# Patient Record
Sex: Female | Born: 1940
Health system: Southern US, Community
[De-identification: ages and names within clinical notes are randomized; demographics above are authoritative.]

---

## 1998-12-30 ENCOUNTER — Other Ambulatory Visit: Admission: RE | Admit: 1998-12-30 | Discharge: 1998-12-30 | Payer: Self-pay | Admitting: Internal Medicine

## 2001-06-11 ENCOUNTER — Other Ambulatory Visit: Admission: RE | Admit: 2001-06-11 | Discharge: 2001-06-11 | Payer: Self-pay | Admitting: Internal Medicine

## 2001-09-18 ENCOUNTER — Ambulatory Visit (HOSPITAL_COMMUNITY): Admission: RE | Admit: 2001-09-18 | Discharge: 2001-09-18 | Payer: Self-pay | Admitting: *Deleted

## 2003-04-22 ENCOUNTER — Ambulatory Visit (HOSPITAL_COMMUNITY): Admission: RE | Admit: 2003-04-22 | Discharge: 2003-04-22 | Payer: Self-pay | Admitting: *Deleted

## 2003-07-29 ENCOUNTER — Encounter: Admission: RE | Admit: 2003-07-29 | Discharge: 2003-07-29 | Payer: Self-pay | Admitting: Internal Medicine

## 2006-03-06 ENCOUNTER — Other Ambulatory Visit: Admission: RE | Admit: 2006-03-06 | Discharge: 2006-03-06 | Payer: Self-pay | Admitting: Internal Medicine

## 2006-08-27 ENCOUNTER — Inpatient Hospital Stay (HOSPITAL_COMMUNITY): Admission: AD | Admit: 2006-08-27 | Discharge: 2006-08-27 | Payer: Self-pay | Admitting: Obstetrics & Gynecology

## 2006-08-29 ENCOUNTER — Ambulatory Visit (HOSPITAL_COMMUNITY): Admission: RE | Admit: 2006-08-29 | Discharge: 2006-08-29 | Payer: Self-pay | Admitting: *Deleted

## 2011-09-07 DIAGNOSIS — H35349 Macular cyst, hole, or pseudohole, unspecified eye: Secondary | ICD-10-CM | POA: Diagnosis not present

## 2011-09-07 DIAGNOSIS — H43819 Vitreous degeneration, unspecified eye: Secondary | ICD-10-CM | POA: Diagnosis not present

## 2011-09-07 DIAGNOSIS — H35379 Puckering of macula, unspecified eye: Secondary | ICD-10-CM | POA: Diagnosis not present

## 2011-10-18 DIAGNOSIS — H2510 Age-related nuclear cataract, unspecified eye: Secondary | ICD-10-CM | POA: Diagnosis not present

## 2011-11-13 DIAGNOSIS — H43819 Vitreous degeneration, unspecified eye: Secondary | ICD-10-CM | POA: Diagnosis not present

## 2011-11-13 DIAGNOSIS — H35349 Macular cyst, hole, or pseudohole, unspecified eye: Secondary | ICD-10-CM | POA: Diagnosis not present

## 2012-05-03 DIAGNOSIS — H35349 Macular cyst, hole, or pseudohole, unspecified eye: Secondary | ICD-10-CM | POA: Diagnosis not present

## 2012-05-03 DIAGNOSIS — H2510 Age-related nuclear cataract, unspecified eye: Secondary | ICD-10-CM | POA: Diagnosis not present

## 2012-05-20 DIAGNOSIS — H43829 Vitreomacular adhesion, unspecified eye: Secondary | ICD-10-CM | POA: Diagnosis not present

## 2012-05-20 DIAGNOSIS — H35349 Macular cyst, hole, or pseudohole, unspecified eye: Secondary | ICD-10-CM | POA: Diagnosis not present

## 2012-10-17 DIAGNOSIS — L738 Other specified follicular disorders: Secondary | ICD-10-CM | POA: Diagnosis not present

## 2012-10-17 DIAGNOSIS — L259 Unspecified contact dermatitis, unspecified cause: Secondary | ICD-10-CM | POA: Diagnosis not present

## 2012-10-17 DIAGNOSIS — D235 Other benign neoplasm of skin of trunk: Secondary | ICD-10-CM | POA: Diagnosis not present

## 2012-10-17 DIAGNOSIS — L570 Actinic keratosis: Secondary | ICD-10-CM | POA: Diagnosis not present

## 2012-11-06 DIAGNOSIS — I10 Essential (primary) hypertension: Secondary | ICD-10-CM | POA: Diagnosis not present

## 2012-11-07 DIAGNOSIS — I10 Essential (primary) hypertension: Secondary | ICD-10-CM | POA: Diagnosis not present

## 2012-11-07 DIAGNOSIS — Z0000 Encounter for general adult medical examination without abnormal findings: Secondary | ICD-10-CM | POA: Diagnosis not present

## 2012-11-12 DIAGNOSIS — I10 Essential (primary) hypertension: Secondary | ICD-10-CM | POA: Diagnosis not present

## 2012-12-31 DIAGNOSIS — I10 Essential (primary) hypertension: Secondary | ICD-10-CM | POA: Diagnosis not present

## 2013-02-04 DIAGNOSIS — H35349 Macular cyst, hole, or pseudohole, unspecified eye: Secondary | ICD-10-CM | POA: Insufficient documentation

## 2013-02-04 DIAGNOSIS — H2510 Age-related nuclear cataract, unspecified eye: Secondary | ICD-10-CM | POA: Diagnosis not present

## 2013-02-04 DIAGNOSIS — H43829 Vitreomacular adhesion, unspecified eye: Secondary | ICD-10-CM | POA: Diagnosis not present

## 2013-03-03 DIAGNOSIS — Z0000 Encounter for general adult medical examination without abnormal findings: Secondary | ICD-10-CM | POA: Diagnosis not present

## 2013-03-03 DIAGNOSIS — I10 Essential (primary) hypertension: Secondary | ICD-10-CM | POA: Diagnosis not present

## 2013-03-13 DIAGNOSIS — Z0000 Encounter for general adult medical examination without abnormal findings: Secondary | ICD-10-CM | POA: Diagnosis not present

## 2013-03-14 DIAGNOSIS — I10 Essential (primary) hypertension: Secondary | ICD-10-CM | POA: Diagnosis not present

## 2013-03-14 DIAGNOSIS — Z87440 Personal history of urinary (tract) infections: Secondary | ICD-10-CM | POA: Diagnosis not present

## 2013-03-14 DIAGNOSIS — M899 Disorder of bone, unspecified: Secondary | ICD-10-CM | POA: Diagnosis not present

## 2013-03-14 DIAGNOSIS — Z8349 Family history of other endocrine, nutritional and metabolic diseases: Secondary | ICD-10-CM | POA: Diagnosis not present

## 2013-03-14 DIAGNOSIS — M949 Disorder of cartilage, unspecified: Secondary | ICD-10-CM | POA: Diagnosis not present

## 2013-08-19 DIAGNOSIS — H35379 Puckering of macula, unspecified eye: Secondary | ICD-10-CM | POA: Diagnosis not present

## 2013-08-19 DIAGNOSIS — H43829 Vitreomacular adhesion, unspecified eye: Secondary | ICD-10-CM | POA: Diagnosis not present

## 2013-08-19 DIAGNOSIS — H2510 Age-related nuclear cataract, unspecified eye: Secondary | ICD-10-CM | POA: Diagnosis not present

## 2013-08-19 DIAGNOSIS — H35349 Macular cyst, hole, or pseudohole, unspecified eye: Secondary | ICD-10-CM | POA: Diagnosis not present

## 2013-09-02 DIAGNOSIS — L821 Other seborrheic keratosis: Secondary | ICD-10-CM | POA: Diagnosis not present

## 2013-09-02 DIAGNOSIS — L723 Sebaceous cyst: Secondary | ICD-10-CM | POA: Diagnosis not present

## 2013-09-02 DIAGNOSIS — L259 Unspecified contact dermatitis, unspecified cause: Secondary | ICD-10-CM | POA: Diagnosis not present

## 2013-09-02 DIAGNOSIS — L219 Seborrheic dermatitis, unspecified: Secondary | ICD-10-CM | POA: Diagnosis not present

## 2013-09-10 DIAGNOSIS — H35349 Macular cyst, hole, or pseudohole, unspecified eye: Secondary | ICD-10-CM | POA: Diagnosis not present

## 2013-09-10 DIAGNOSIS — IMO0002 Reserved for concepts with insufficient information to code with codable children: Secondary | ICD-10-CM | POA: Insufficient documentation

## 2013-09-10 DIAGNOSIS — K219 Gastro-esophageal reflux disease without esophagitis: Secondary | ICD-10-CM | POA: Diagnosis not present

## 2013-09-10 DIAGNOSIS — H43829 Vitreomacular adhesion, unspecified eye: Secondary | ICD-10-CM | POA: Diagnosis not present

## 2013-09-10 DIAGNOSIS — H40009 Preglaucoma, unspecified, unspecified eye: Secondary | ICD-10-CM | POA: Diagnosis not present

## 2013-09-10 DIAGNOSIS — H2510 Age-related nuclear cataract, unspecified eye: Secondary | ICD-10-CM | POA: Diagnosis not present

## 2013-09-10 DIAGNOSIS — H35379 Puckering of macula, unspecified eye: Secondary | ICD-10-CM | POA: Diagnosis not present

## 2013-09-10 DIAGNOSIS — Z79899 Other long term (current) drug therapy: Secondary | ICD-10-CM | POA: Diagnosis not present

## 2013-09-10 DIAGNOSIS — I10 Essential (primary) hypertension: Secondary | ICD-10-CM | POA: Diagnosis not present

## 2013-09-11 DIAGNOSIS — Z9849 Cataract extraction status, unspecified eye: Secondary | ICD-10-CM | POA: Insufficient documentation

## 2013-10-28 ENCOUNTER — Ambulatory Visit (INDEPENDENT_AMBULATORY_CARE_PROVIDER_SITE_OTHER): Payer: Medicare Other

## 2013-10-28 VITALS — BP 127/78 | HR 74 | Resp 12

## 2013-10-28 DIAGNOSIS — M722 Plantar fascial fibromatosis: Secondary | ICD-10-CM

## 2013-10-28 DIAGNOSIS — R52 Pain, unspecified: Secondary | ICD-10-CM

## 2013-10-28 MED ORDER — MELOXICAM 15 MG PO TABS: 15.0000 mg | ORAL_TABLET | Freq: Every day | ORAL | Status: DC

## 2013-10-28 NOTE — Patient Instructions (Signed)

## 2013-10-28 NOTE — Progress Notes (Signed)
   Subjective:    Patient ID: Tara Mejia, female    DOB: 04-02-1941, 73 y.o.   MRN: [id number]  HPI BOTH HEELS ARE LITTLE TENDER, ESPECIALLY THE RT FOOT. THE FEET ARE GETTING ARE NOT GETTING WORSE. THE FEET GET AGGRAVATED BY WALKING/SITTING. TRIED TO WEAR GOOD SUPPORT SHOES AND STRETCHING AND IT HELP.    Review of Systems  All other systems reviewed and are negative.       Objective:   Physical Exam Pleas Koch objective findings reveal intact pedal pulses palpable DP and PT posterior were for Refill timed 3-4 seconds all digits skin temperature warm turgor normal no edema rubor varicosity noted neurologic epicritic and proprioceptive sensations unremarkable orthopedic biomechanical exam reveals rectus foot type ankle mid tarsus subtalar joint motions normal patient complaining of pain in the right more so the left inferior heel and arch in a two-year intertrochanteric Larry right foot is definitely tender left is minimally tender on palpation. Patient ambulates worse than comfort shoes a dance toe clogs and other appropriate type shoes for the most part is not certain how it started but it's been bothering her now for couple of months she tries stretching tried changing shoes pads cushioning with little or no improvement. X-rays demonstrates mild inferior calcaneal spur left more so than right no signs of fracture or other osseous abnormalities are noted otherwise rectus foot type noted bilateral. Mild thickening plantar fascial structures identified       Assessment & Plan:  Assessment plantar fasciitis/heel spur syndrome bilateral plan at this time patient is placed hemorrhage with MOBIC 15 mg once daily and ice to the area also thick is placed in fascial strapping of both feet reevaluate with in 2 weeks for followup may be candidate for functional orthoses based on progress or she is wearing good shoes for the most part it just aggravated or bruises to the plantar fascial area right more so  than left denies resting ice elevation and stable shoes at all times recess to accept a strapping and has been beneficial  [employer] DPM

## 2013-10-29 ENCOUNTER — Telehealth: Payer: Self-pay | Admitting: *Deleted

## 2013-10-29 NOTE — Telephone Encounter (Signed)
Call back at your earliest convenience.  I attempted to call her back, left her a message for a return call.

## 2013-11-14 ENCOUNTER — Ambulatory Visit: Payer: No Typology Code available for payment source

## 2013-11-18 ENCOUNTER — Ambulatory Visit: Payer: Medicare Other

## 2013-11-18 VITALS — BP 133/64 | HR 66 | Resp 16 | Ht 61.0 in | Wt 128.0 lb

## 2013-11-18 DIAGNOSIS — R52 Pain, unspecified: Secondary | ICD-10-CM | POA: Diagnosis not present

## 2013-11-18 DIAGNOSIS — M722 Plantar fascial fibromatosis: Secondary | ICD-10-CM

## 2013-11-18 NOTE — Patient Instructions (Signed)

## 2013-11-18 NOTE — Progress Notes (Signed)
   Subjective:    Patient ID: Tara Mejia, female    DOB: 1940/09/11, 73 y.o.   MRN: [id number]  HPI Comments: Pt states she felt the strappings did help, but the feet continue to hurt after the removal of the straps, especially in the morning.     Review of Systems no systemic changes or findings noted at this time.     Objective:   Physical Exam Neurovascular status is intact with pedal pulses palpable DP and PT +2/4 bilateral capillary refill time 3 seconds epicritic and proprioceptive sensations intact patient is mild flexible digital contractures continues to have some tenderness the strapping definite improvement left more so than right foot still has some slight pain for the most part she walked around and shoes sleeping comfortably is to-year-old to have some structure support or new balance athletic shoes do not have arch support in them and it might benefit from orthoses this time a recommendation for over-the-counter power step orthoses as a trial adjunct to therapy patient may be K. for prescription orthoses for possible steroid injections if fails to maintain improvement over time. No other changes or significant findings noted at this time to       Assessment & Plan:  Assessment plantar fasciitis/heel spur syndrome patient maintaining the anti-inflammatory and maintain good shoes suggest a power step orthoses at this time which are dispensed with instructions and and use of orthoses instructions are given recheck in 2 months for an as-needed basis for your orthotic adjustments or more aggressive invasive options if needed next  Harriet Masson DPM

## 2014-02-13 DIAGNOSIS — L678 Other hair color and hair shaft abnormalities: Secondary | ICD-10-CM | POA: Diagnosis not present

## 2014-02-13 DIAGNOSIS — D235 Other benign neoplasm of skin of trunk: Secondary | ICD-10-CM | POA: Diagnosis not present

## 2014-02-13 DIAGNOSIS — L738 Other specified follicular disorders: Secondary | ICD-10-CM | POA: Diagnosis not present

## 2014-03-13 DIAGNOSIS — H35349 Macular cyst, hole, or pseudohole, unspecified eye: Secondary | ICD-10-CM | POA: Diagnosis not present

## 2014-03-13 DIAGNOSIS — H40009 Preglaucoma, unspecified, unspecified eye: Secondary | ICD-10-CM | POA: Diagnosis not present

## 2014-03-13 DIAGNOSIS — H35379 Puckering of macula, unspecified eye: Secondary | ICD-10-CM | POA: Diagnosis not present

## 2014-03-13 DIAGNOSIS — H2510 Age-related nuclear cataract, unspecified eye: Secondary | ICD-10-CM | POA: Diagnosis not present

## 2014-08-18 DIAGNOSIS — B9689 Other specified bacterial agents as the cause of diseases classified elsewhere: Secondary | ICD-10-CM | POA: Diagnosis not present

## 2014-08-18 DIAGNOSIS — L219 Seborrheic dermatitis, unspecified: Secondary | ICD-10-CM | POA: Diagnosis not present

## 2014-08-18 DIAGNOSIS — L02821 Furuncle of head [any part, except face]: Secondary | ICD-10-CM | POA: Diagnosis not present

## 2014-08-18 DIAGNOSIS — D225 Melanocytic nevi of trunk: Secondary | ICD-10-CM | POA: Diagnosis not present

## 2014-09-08 DIAGNOSIS — H35372 Puckering of macula, left eye: Secondary | ICD-10-CM | POA: Diagnosis not present

## 2014-09-08 DIAGNOSIS — H40003 Preglaucoma, unspecified, bilateral: Secondary | ICD-10-CM | POA: Diagnosis not present

## 2014-09-08 DIAGNOSIS — H35341 Macular cyst, hole, or pseudohole, right eye: Secondary | ICD-10-CM | POA: Diagnosis not present

## 2014-09-08 DIAGNOSIS — H2512 Age-related nuclear cataract, left eye: Secondary | ICD-10-CM | POA: Diagnosis not present

## 2014-09-09 DIAGNOSIS — H43812 Vitreous degeneration, left eye: Secondary | ICD-10-CM | POA: Insufficient documentation

## 2014-10-02 DIAGNOSIS — H35372 Puckering of macula, left eye: Secondary | ICD-10-CM | POA: Diagnosis not present

## 2014-10-19 DIAGNOSIS — H43812 Vitreous degeneration, left eye: Secondary | ICD-10-CM | POA: Diagnosis not present

## 2014-10-19 DIAGNOSIS — H35372 Puckering of macula, left eye: Secondary | ICD-10-CM | POA: Diagnosis not present

## 2014-10-19 DIAGNOSIS — H35341 Macular cyst, hole, or pseudohole, right eye: Secondary | ICD-10-CM | POA: Diagnosis not present

## 2014-12-14 DIAGNOSIS — H35341 Macular cyst, hole, or pseudohole, right eye: Secondary | ICD-10-CM | POA: Diagnosis not present

## 2014-12-14 DIAGNOSIS — H43812 Vitreous degeneration, left eye: Secondary | ICD-10-CM | POA: Diagnosis not present

## 2014-12-14 DIAGNOSIS — H35372 Puckering of macula, left eye: Secondary | ICD-10-CM | POA: Diagnosis not present

## 2015-03-10 DIAGNOSIS — R0781 Pleurodynia: Secondary | ICD-10-CM | POA: Diagnosis not present

## 2015-03-10 DIAGNOSIS — M79673 Pain in unspecified foot: Secondary | ICD-10-CM | POA: Diagnosis not present

## 2015-03-10 DIAGNOSIS — S99922A Unspecified injury of left foot, initial encounter: Secondary | ICD-10-CM | POA: Diagnosis not present

## 2015-06-03 DIAGNOSIS — I10 Essential (primary) hypertension: Secondary | ICD-10-CM | POA: Diagnosis not present

## 2015-06-07 DIAGNOSIS — Z8719 Personal history of other diseases of the digestive system: Secondary | ICD-10-CM | POA: Diagnosis not present

## 2015-06-07 DIAGNOSIS — K449 Diaphragmatic hernia without obstruction or gangrene: Secondary | ICD-10-CM | POA: Diagnosis not present

## 2015-06-07 DIAGNOSIS — I10 Essential (primary) hypertension: Secondary | ICD-10-CM | POA: Diagnosis not present

## 2015-06-07 DIAGNOSIS — L309 Dermatitis, unspecified: Secondary | ICD-10-CM | POA: Diagnosis not present

## 2015-06-19 DIAGNOSIS — Z1211 Encounter for screening for malignant neoplasm of colon: Secondary | ICD-10-CM | POA: Diagnosis not present

## 2015-06-19 DIAGNOSIS — Z1212 Encounter for screening for malignant neoplasm of rectum: Secondary | ICD-10-CM | POA: Diagnosis not present

## 2015-10-12 DIAGNOSIS — L0212 Furuncle of neck: Secondary | ICD-10-CM | POA: Diagnosis not present

## 2015-10-12 DIAGNOSIS — R413 Other amnesia: Secondary | ICD-10-CM | POA: Diagnosis not present

## 2015-10-12 DIAGNOSIS — L02821 Furuncle of head [any part, except face]: Secondary | ICD-10-CM | POA: Diagnosis not present

## 2015-10-12 DIAGNOSIS — B9689 Other specified bacterial agents as the cause of diseases classified elsewhere: Secondary | ICD-10-CM | POA: Diagnosis not present

## 2016-04-24 DIAGNOSIS — K219 Gastro-esophageal reflux disease without esophagitis: Secondary | ICD-10-CM | POA: Diagnosis not present

## 2016-06-09 DIAGNOSIS — M8580 Other specified disorders of bone density and structure, unspecified site: Secondary | ICD-10-CM | POA: Diagnosis not present

## 2016-06-09 DIAGNOSIS — Z0000 Encounter for general adult medical examination without abnormal findings: Secondary | ICD-10-CM | POA: Diagnosis not present

## 2016-06-09 DIAGNOSIS — I10 Essential (primary) hypertension: Secondary | ICD-10-CM | POA: Diagnosis not present

## 2016-06-09 DIAGNOSIS — E559 Vitamin D deficiency, unspecified: Secondary | ICD-10-CM | POA: Diagnosis not present

## 2016-06-14 DIAGNOSIS — Z8719 Personal history of other diseases of the digestive system: Secondary | ICD-10-CM | POA: Diagnosis not present

## 2016-06-14 DIAGNOSIS — L309 Dermatitis, unspecified: Secondary | ICD-10-CM | POA: Diagnosis not present

## 2016-06-14 DIAGNOSIS — E559 Vitamin D deficiency, unspecified: Secondary | ICD-10-CM | POA: Diagnosis not present

## 2016-06-14 DIAGNOSIS — K449 Diaphragmatic hernia without obstruction or gangrene: Secondary | ICD-10-CM | POA: Diagnosis not present

## 2016-11-15 DIAGNOSIS — R194 Change in bowel habit: Secondary | ICD-10-CM | POA: Diagnosis not present

## 2016-11-15 DIAGNOSIS — Z8719 Personal history of other diseases of the digestive system: Secondary | ICD-10-CM | POA: Diagnosis not present

## 2016-11-15 DIAGNOSIS — Z8711 Personal history of peptic ulcer disease: Secondary | ICD-10-CM | POA: Diagnosis not present

## 2016-12-20 DIAGNOSIS — K279 Peptic ulcer, site unspecified, unspecified as acute or chronic, without hemorrhage or perforation: Secondary | ICD-10-CM | POA: Diagnosis not present

## 2016-12-20 DIAGNOSIS — K2930 Chronic superficial gastritis without bleeding: Secondary | ICD-10-CM | POA: Diagnosis not present

## 2016-12-20 DIAGNOSIS — K219 Gastro-esophageal reflux disease without esophagitis: Secondary | ICD-10-CM | POA: Diagnosis not present

## 2016-12-20 DIAGNOSIS — R1013 Epigastric pain: Secondary | ICD-10-CM | POA: Diagnosis not present

## 2016-12-20 DIAGNOSIS — K2900 Acute gastritis without bleeding: Secondary | ICD-10-CM | POA: Diagnosis not present

## 2016-12-20 DIAGNOSIS — K210 Gastro-esophageal reflux disease with esophagitis: Secondary | ICD-10-CM | POA: Diagnosis not present

## 2016-12-20 DIAGNOSIS — K2970 Gastritis, unspecified, without bleeding: Secondary | ICD-10-CM | POA: Diagnosis not present

## 2017-01-10 DIAGNOSIS — R1013 Epigastric pain: Secondary | ICD-10-CM | POA: Diagnosis not present

## 2017-01-10 DIAGNOSIS — R197 Diarrhea, unspecified: Secondary | ICD-10-CM | POA: Diagnosis not present

## 2017-03-12 DIAGNOSIS — D1801 Hemangioma of skin and subcutaneous tissue: Secondary | ICD-10-CM | POA: Diagnosis not present

## 2017-03-12 DIAGNOSIS — D485 Neoplasm of uncertain behavior of skin: Secondary | ICD-10-CM | POA: Diagnosis not present

## 2017-03-12 DIAGNOSIS — L821 Other seborrheic keratosis: Secondary | ICD-10-CM | POA: Diagnosis not present

## 2017-03-12 DIAGNOSIS — D225 Melanocytic nevi of trunk: Secondary | ICD-10-CM | POA: Diagnosis not present

## 2017-03-12 DIAGNOSIS — L820 Inflamed seborrheic keratosis: Secondary | ICD-10-CM | POA: Diagnosis not present

## 2017-05-02 DIAGNOSIS — I10 Essential (primary) hypertension: Secondary | ICD-10-CM | POA: Diagnosis not present

## 2017-05-02 DIAGNOSIS — M8580 Other specified disorders of bone density and structure, unspecified site: Secondary | ICD-10-CM | POA: Diagnosis not present

## 2017-08-21 DIAGNOSIS — N390 Urinary tract infection, site not specified: Secondary | ICD-10-CM | POA: Diagnosis not present

## 2017-08-21 DIAGNOSIS — Z0000 Encounter for general adult medical examination without abnormal findings: Secondary | ICD-10-CM | POA: Diagnosis not present

## 2017-08-21 DIAGNOSIS — E559 Vitamin D deficiency, unspecified: Secondary | ICD-10-CM | POA: Diagnosis not present

## 2017-08-21 DIAGNOSIS — I10 Essential (primary) hypertension: Secondary | ICD-10-CM | POA: Diagnosis not present

## 2017-08-21 DIAGNOSIS — M8580 Other specified disorders of bone density and structure, unspecified site: Secondary | ICD-10-CM | POA: Diagnosis not present

## 2017-08-22 DIAGNOSIS — L309 Dermatitis, unspecified: Secondary | ICD-10-CM | POA: Diagnosis not present

## 2017-08-22 DIAGNOSIS — Z87440 Personal history of urinary (tract) infections: Secondary | ICD-10-CM | POA: Diagnosis not present

## 2017-08-22 DIAGNOSIS — Z8719 Personal history of other diseases of the digestive system: Secondary | ICD-10-CM | POA: Diagnosis not present

## 2017-08-22 DIAGNOSIS — M8580 Other specified disorders of bone density and structure, unspecified site: Secondary | ICD-10-CM | POA: Diagnosis not present

## 2017-08-22 DIAGNOSIS — K140 Glossitis: Secondary | ICD-10-CM | POA: Diagnosis not present

## 2017-08-22 DIAGNOSIS — L301 Dyshidrosis [pompholyx]: Secondary | ICD-10-CM | POA: Diagnosis not present

## 2017-08-22 DIAGNOSIS — E559 Vitamin D deficiency, unspecified: Secondary | ICD-10-CM | POA: Diagnosis not present

## 2017-08-22 DIAGNOSIS — Z8349 Family history of other endocrine, nutritional and metabolic diseases: Secondary | ICD-10-CM | POA: Diagnosis not present

## 2017-08-22 DIAGNOSIS — I10 Essential (primary) hypertension: Secondary | ICD-10-CM | POA: Diagnosis not present

## 2017-08-22 DIAGNOSIS — G4485 Primary stabbing headache: Secondary | ICD-10-CM | POA: Diagnosis not present

## 2017-08-22 DIAGNOSIS — K449 Diaphragmatic hernia without obstruction or gangrene: Secondary | ICD-10-CM | POA: Diagnosis not present

## 2017-08-28 DIAGNOSIS — G319 Degenerative disease of nervous system, unspecified: Secondary | ICD-10-CM | POA: Diagnosis not present

## 2017-08-28 DIAGNOSIS — G4485 Primary stabbing headache: Secondary | ICD-10-CM | POA: Diagnosis not present

## 2017-08-29 DIAGNOSIS — Z1231 Encounter for screening mammogram for malignant neoplasm of breast: Secondary | ICD-10-CM | POA: Diagnosis not present

## 2017-09-11 DIAGNOSIS — L570 Actinic keratosis: Secondary | ICD-10-CM | POA: Diagnosis not present

## 2017-09-11 DIAGNOSIS — D1800 Hemangioma unspecified site: Secondary | ICD-10-CM | POA: Diagnosis not present

## 2017-09-11 DIAGNOSIS — L814 Other melanin hyperpigmentation: Secondary | ICD-10-CM | POA: Diagnosis not present

## 2017-09-11 DIAGNOSIS — Z85828 Personal history of other malignant neoplasm of skin: Secondary | ICD-10-CM | POA: Diagnosis not present

## 2017-09-11 DIAGNOSIS — L821 Other seborrheic keratosis: Secondary | ICD-10-CM | POA: Diagnosis not present

## 2018-01-23 DIAGNOSIS — H903 Sensorineural hearing loss, bilateral: Secondary | ICD-10-CM | POA: Diagnosis not present

## 2018-02-07 DIAGNOSIS — H6983 Other specified disorders of Eustachian tube, bilateral: Secondary | ICD-10-CM | POA: Diagnosis not present

## 2018-03-11 DIAGNOSIS — L821 Other seborrheic keratosis: Secondary | ICD-10-CM | POA: Diagnosis not present

## 2018-03-11 DIAGNOSIS — L570 Actinic keratosis: Secondary | ICD-10-CM | POA: Diagnosis not present

## 2018-03-11 DIAGNOSIS — D1801 Hemangioma of skin and subcutaneous tissue: Secondary | ICD-10-CM | POA: Diagnosis not present

## 2018-04-06 DIAGNOSIS — K449 Diaphragmatic hernia without obstruction or gangrene: Secondary | ICD-10-CM | POA: Diagnosis not present

## 2018-04-06 DIAGNOSIS — K219 Gastro-esophageal reflux disease without esophagitis: Secondary | ICD-10-CM | POA: Insufficient documentation

## 2018-04-06 DIAGNOSIS — I10 Essential (primary) hypertension: Secondary | ICD-10-CM | POA: Diagnosis not present

## 2018-04-06 DIAGNOSIS — R42 Dizziness and giddiness: Secondary | ICD-10-CM | POA: Diagnosis not present

## 2018-04-06 DIAGNOSIS — R0789 Other chest pain: Secondary | ICD-10-CM | POA: Diagnosis not present

## 2018-04-06 DIAGNOSIS — R079 Chest pain, unspecified: Secondary | ICD-10-CM | POA: Diagnosis not present

## 2018-04-06 DIAGNOSIS — H9192 Unspecified hearing loss, left ear: Secondary | ICD-10-CM | POA: Insufficient documentation

## 2018-04-07 DIAGNOSIS — H9192 Unspecified hearing loss, left ear: Secondary | ICD-10-CM | POA: Diagnosis not present

## 2018-04-07 DIAGNOSIS — K219 Gastro-esophageal reflux disease without esophagitis: Secondary | ICD-10-CM | POA: Diagnosis not present

## 2018-04-07 DIAGNOSIS — R079 Chest pain, unspecified: Secondary | ICD-10-CM | POA: Diagnosis not present

## 2018-04-07 DIAGNOSIS — I10 Essential (primary) hypertension: Secondary | ICD-10-CM | POA: Diagnosis not present

## 2018-04-15 DIAGNOSIS — I10 Essential (primary) hypertension: Secondary | ICD-10-CM | POA: Diagnosis not present

## 2018-04-15 DIAGNOSIS — R0789 Other chest pain: Secondary | ICD-10-CM | POA: Diagnosis not present

## 2018-04-15 DIAGNOSIS — K209 Esophagitis, unspecified: Secondary | ICD-10-CM | POA: Diagnosis not present

## 2018-04-15 DIAGNOSIS — Z789 Other specified health status: Secondary | ICD-10-CM | POA: Diagnosis not present

## 2018-07-08 DIAGNOSIS — H35372 Puckering of macula, left eye: Secondary | ICD-10-CM | POA: Diagnosis not present

## 2018-08-27 DIAGNOSIS — I10 Essential (primary) hypertension: Secondary | ICD-10-CM | POA: Diagnosis not present

## 2018-08-29 DIAGNOSIS — M26629 Arthralgia of temporomandibular joint, unspecified side: Secondary | ICD-10-CM | POA: Diagnosis not present

## 2018-08-29 DIAGNOSIS — M810 Age-related osteoporosis without current pathological fracture: Secondary | ICD-10-CM | POA: Diagnosis not present

## 2018-08-29 DIAGNOSIS — I10 Essential (primary) hypertension: Secondary | ICD-10-CM | POA: Diagnosis not present

## 2018-08-29 DIAGNOSIS — L853 Xerosis cutis: Secondary | ICD-10-CM | POA: Diagnosis not present

## 2018-08-29 DIAGNOSIS — K449 Diaphragmatic hernia without obstruction or gangrene: Secondary | ICD-10-CM | POA: Diagnosis not present

## 2018-08-29 DIAGNOSIS — M8580 Other specified disorders of bone density and structure, unspecified site: Secondary | ICD-10-CM | POA: Diagnosis not present

## 2019-01-15 DIAGNOSIS — I10 Essential (primary) hypertension: Secondary | ICD-10-CM | POA: Diagnosis not present

## 2019-01-23 DIAGNOSIS — I10 Essential (primary) hypertension: Secondary | ICD-10-CM | POA: Diagnosis not present

## 2019-01-23 DIAGNOSIS — K209 Esophagitis, unspecified: Secondary | ICD-10-CM | POA: Diagnosis not present

## 2019-01-23 DIAGNOSIS — M8580 Other specified disorders of bone density and structure, unspecified site: Secondary | ICD-10-CM | POA: Diagnosis not present

## 2019-02-13 DIAGNOSIS — H2512 Age-related nuclear cataract, left eye: Secondary | ICD-10-CM | POA: Diagnosis not present

## 2019-02-13 DIAGNOSIS — H43812 Vitreous degeneration, left eye: Secondary | ICD-10-CM | POA: Diagnosis not present

## 2019-02-26 DIAGNOSIS — L03115 Cellulitis of right lower limb: Secondary | ICD-10-CM | POA: Diagnosis not present

## 2019-02-26 DIAGNOSIS — S80861A Insect bite (nonvenomous), right lower leg, initial encounter: Secondary | ICD-10-CM | POA: Diagnosis not present

## 2019-02-26 DIAGNOSIS — W57XXXA Bitten or stung by nonvenomous insect and other nonvenomous arthropods, initial encounter: Secondary | ICD-10-CM | POA: Diagnosis not present

## 2019-03-12 DIAGNOSIS — Z1231 Encounter for screening mammogram for malignant neoplasm of breast: Secondary | ICD-10-CM | POA: Diagnosis not present

## 2019-04-08 DIAGNOSIS — D1801 Hemangioma of skin and subcutaneous tissue: Secondary | ICD-10-CM | POA: Diagnosis not present

## 2019-04-08 DIAGNOSIS — D225 Melanocytic nevi of trunk: Secondary | ICD-10-CM | POA: Diagnosis not present

## 2019-04-08 DIAGNOSIS — L821 Other seborrheic keratosis: Secondary | ICD-10-CM | POA: Diagnosis not present

## 2019-04-08 DIAGNOSIS — D485 Neoplasm of uncertain behavior of skin: Secondary | ICD-10-CM | POA: Diagnosis not present

## 2019-04-08 DIAGNOSIS — L814 Other melanin hyperpigmentation: Secondary | ICD-10-CM | POA: Diagnosis not present

## 2019-04-09 DIAGNOSIS — Z1211 Encounter for screening for malignant neoplasm of colon: Secondary | ICD-10-CM | POA: Diagnosis not present

## 2019-04-09 DIAGNOSIS — Z1212 Encounter for screening for malignant neoplasm of rectum: Secondary | ICD-10-CM | POA: Diagnosis not present

## 2019-04-16 DIAGNOSIS — R51 Headache: Secondary | ICD-10-CM | POA: Diagnosis not present

## 2019-04-16 DIAGNOSIS — G5622 Lesion of ulnar nerve, left upper limb: Secondary | ICD-10-CM | POA: Diagnosis not present

## 2019-08-21 DIAGNOSIS — Z1211 Encounter for screening for malignant neoplasm of colon: Secondary | ICD-10-CM | POA: Diagnosis not present

## 2019-08-21 DIAGNOSIS — Z124 Encounter for screening for malignant neoplasm of cervix: Secondary | ICD-10-CM | POA: Diagnosis not present

## 2019-08-21 DIAGNOSIS — Z6823 Body mass index (BMI) 23.0-23.9, adult: Secondary | ICD-10-CM | POA: Diagnosis not present

## 2019-08-21 DIAGNOSIS — Z01419 Encounter for gynecological examination (general) (routine) without abnormal findings: Secondary | ICD-10-CM | POA: Diagnosis not present

## 2019-09-08 DIAGNOSIS — M8580 Other specified disorders of bone density and structure, unspecified site: Secondary | ICD-10-CM | POA: Diagnosis not present

## 2019-09-08 DIAGNOSIS — E559 Vitamin D deficiency, unspecified: Secondary | ICD-10-CM | POA: Diagnosis not present

## 2019-09-08 DIAGNOSIS — I10 Essential (primary) hypertension: Secondary | ICD-10-CM | POA: Diagnosis not present

## 2019-09-10 DIAGNOSIS — M8580 Other specified disorders of bone density and structure, unspecified site: Secondary | ICD-10-CM | POA: Diagnosis not present

## 2019-09-10 DIAGNOSIS — M79604 Pain in right leg: Secondary | ICD-10-CM | POA: Diagnosis not present

## 2019-09-10 DIAGNOSIS — I10 Essential (primary) hypertension: Secondary | ICD-10-CM | POA: Diagnosis not present

## 2019-09-10 DIAGNOSIS — E559 Vitamin D deficiency, unspecified: Secondary | ICD-10-CM | POA: Diagnosis not present

## 2019-09-10 DIAGNOSIS — G5622 Lesion of ulnar nerve, left upper limb: Secondary | ICD-10-CM | POA: Diagnosis not present

## 2019-09-10 DIAGNOSIS — Z0000 Encounter for general adult medical examination without abnormal findings: Secondary | ICD-10-CM | POA: Diagnosis not present

## 2019-09-10 DIAGNOSIS — M79605 Pain in left leg: Secondary | ICD-10-CM | POA: Diagnosis not present

## 2019-09-11 DIAGNOSIS — Z23 Encounter for immunization: Secondary | ICD-10-CM | POA: Diagnosis not present

## 2019-09-29 DIAGNOSIS — M549 Dorsalgia, unspecified: Secondary | ICD-10-CM | POA: Diagnosis not present

## 2019-09-29 DIAGNOSIS — Z719 Counseling, unspecified: Secondary | ICD-10-CM | POA: Diagnosis not present

## 2019-09-29 DIAGNOSIS — M79606 Pain in leg, unspecified: Secondary | ICD-10-CM | POA: Diagnosis not present

## 2019-10-06 DIAGNOSIS — M4317 Spondylolisthesis, lumbosacral region: Secondary | ICD-10-CM | POA: Diagnosis not present

## 2019-10-06 DIAGNOSIS — N281 Cyst of kidney, acquired: Secondary | ICD-10-CM | POA: Diagnosis not present

## 2019-10-06 DIAGNOSIS — M48061 Spinal stenosis, lumbar region without neurogenic claudication: Secondary | ICD-10-CM | POA: Diagnosis not present

## 2019-10-06 DIAGNOSIS — M549 Dorsalgia, unspecified: Secondary | ICD-10-CM | POA: Diagnosis not present

## 2019-10-07 DIAGNOSIS — Z23 Encounter for immunization: Secondary | ICD-10-CM | POA: Diagnosis not present

## 2019-10-13 DIAGNOSIS — Z1211 Encounter for screening for malignant neoplasm of colon: Secondary | ICD-10-CM | POA: Diagnosis not present

## 2019-10-13 DIAGNOSIS — K635 Polyp of colon: Secondary | ICD-10-CM | POA: Diagnosis not present

## 2019-10-13 DIAGNOSIS — K5730 Diverticulosis of large intestine without perforation or abscess without bleeding: Secondary | ICD-10-CM | POA: Diagnosis not present

## 2020-02-12 DIAGNOSIS — Z1159 Encounter for screening for other viral diseases: Secondary | ICD-10-CM | POA: Diagnosis not present

## 2020-02-12 DIAGNOSIS — I10 Essential (primary) hypertension: Secondary | ICD-10-CM | POA: Diagnosis not present

## 2020-03-19 DIAGNOSIS — H1012 Acute atopic conjunctivitis, left eye: Secondary | ICD-10-CM | POA: Diagnosis not present

## 2020-03-23 DIAGNOSIS — D1801 Hemangioma of skin and subcutaneous tissue: Secondary | ICD-10-CM | POA: Diagnosis not present

## 2020-03-23 DIAGNOSIS — L814 Other melanin hyperpigmentation: Secondary | ICD-10-CM | POA: Diagnosis not present

## 2020-03-23 DIAGNOSIS — D225 Melanocytic nevi of trunk: Secondary | ICD-10-CM | POA: Diagnosis not present

## 2020-03-23 DIAGNOSIS — L821 Other seborrheic keratosis: Secondary | ICD-10-CM | POA: Diagnosis not present

## 2020-06-02 DIAGNOSIS — M5481 Occipital neuralgia: Secondary | ICD-10-CM | POA: Diagnosis not present

## 2020-07-01 DIAGNOSIS — D72819 Decreased white blood cell count, unspecified: Secondary | ICD-10-CM | POA: Diagnosis not present

## 2020-07-01 DIAGNOSIS — F411 Generalized anxiety disorder: Secondary | ICD-10-CM | POA: Diagnosis not present

## 2020-07-01 DIAGNOSIS — E559 Vitamin D deficiency, unspecified: Secondary | ICD-10-CM | POA: Diagnosis not present

## 2020-07-27 DIAGNOSIS — Z23 Encounter for immunization: Secondary | ICD-10-CM | POA: Diagnosis not present

## 2020-08-16 DIAGNOSIS — S0083XA Contusion of other part of head, initial encounter: Secondary | ICD-10-CM | POA: Diagnosis not present

## 2020-08-16 DIAGNOSIS — W19XXXA Unspecified fall, initial encounter: Secondary | ICD-10-CM | POA: Diagnosis not present

## 2020-08-16 DIAGNOSIS — S0990XA Unspecified injury of head, initial encounter: Secondary | ICD-10-CM | POA: Diagnosis not present

## 2020-08-23 DIAGNOSIS — D72819 Decreased white blood cell count, unspecified: Secondary | ICD-10-CM | POA: Diagnosis not present

## 2020-08-23 DIAGNOSIS — I10 Essential (primary) hypertension: Secondary | ICD-10-CM | POA: Diagnosis not present

## 2020-08-23 DIAGNOSIS — M1990 Unspecified osteoarthritis, unspecified site: Secondary | ICD-10-CM | POA: Diagnosis not present

## 2020-08-25 DIAGNOSIS — H35341 Macular cyst, hole, or pseudohole, right eye: Secondary | ICD-10-CM | POA: Diagnosis not present

## 2020-08-25 DIAGNOSIS — H2512 Age-related nuclear cataract, left eye: Secondary | ICD-10-CM | POA: Diagnosis not present

## 2020-08-26 DIAGNOSIS — I10 Essential (primary) hypertension: Secondary | ICD-10-CM | POA: Diagnosis not present

## 2020-08-26 DIAGNOSIS — M1990 Unspecified osteoarthritis, unspecified site: Secondary | ICD-10-CM | POA: Diagnosis not present

## 2020-08-26 DIAGNOSIS — Z780 Asymptomatic menopausal state: Secondary | ICD-10-CM | POA: Diagnosis not present

## 2020-09-01 DIAGNOSIS — M810 Age-related osteoporosis without current pathological fracture: Secondary | ICD-10-CM | POA: Diagnosis not present

## 2020-09-14 ENCOUNTER — Other Ambulatory Visit: Payer: Self-pay | Admitting: Internal Medicine

## 2020-09-14 DIAGNOSIS — M810 Age-related osteoporosis without current pathological fracture: Secondary | ICD-10-CM | POA: Diagnosis not present

## 2020-09-14 DIAGNOSIS — Z0000 Encounter for general adult medical examination without abnormal findings: Secondary | ICD-10-CM | POA: Diagnosis not present

## 2020-09-14 DIAGNOSIS — M8580 Other specified disorders of bone density and structure, unspecified site: Secondary | ICD-10-CM | POA: Diagnosis not present

## 2020-09-14 DIAGNOSIS — I10 Essential (primary) hypertension: Secondary | ICD-10-CM

## 2020-09-14 DIAGNOSIS — F5104 Psychophysiologic insomnia: Secondary | ICD-10-CM | POA: Diagnosis not present

## 2020-09-29 DIAGNOSIS — Z1231 Encounter for screening mammogram for malignant neoplasm of breast: Secondary | ICD-10-CM | POA: Diagnosis not present

## 2021-02-09 DIAGNOSIS — I8390 Asymptomatic varicose veins of unspecified lower extremity: Secondary | ICD-10-CM | POA: Diagnosis not present

## 2021-02-09 DIAGNOSIS — I872 Venous insufficiency (chronic) (peripheral): Secondary | ICD-10-CM | POA: Diagnosis not present

## 2021-03-17 DIAGNOSIS — M8580 Other specified disorders of bone density and structure, unspecified site: Secondary | ICD-10-CM | POA: Diagnosis not present

## 2021-03-17 DIAGNOSIS — E7800 Pure hypercholesterolemia, unspecified: Secondary | ICD-10-CM | POA: Diagnosis not present

## 2021-03-17 DIAGNOSIS — I10 Essential (primary) hypertension: Secondary | ICD-10-CM | POA: Diagnosis not present

## 2021-03-17 DIAGNOSIS — M79605 Pain in left leg: Secondary | ICD-10-CM | POA: Diagnosis not present

## 2021-03-17 DIAGNOSIS — M79604 Pain in right leg: Secondary | ICD-10-CM | POA: Diagnosis not present

## 2021-03-23 DIAGNOSIS — D229 Melanocytic nevi, unspecified: Secondary | ICD-10-CM | POA: Diagnosis not present

## 2021-03-23 DIAGNOSIS — L814 Other melanin hyperpigmentation: Secondary | ICD-10-CM | POA: Diagnosis not present

## 2021-03-23 DIAGNOSIS — L821 Other seborrheic keratosis: Secondary | ICD-10-CM | POA: Diagnosis not present

## 2021-04-05 DIAGNOSIS — I8390 Asymptomatic varicose veins of unspecified lower extremity: Secondary | ICD-10-CM | POA: Diagnosis not present

## 2021-04-05 DIAGNOSIS — I872 Venous insufficiency (chronic) (peripheral): Secondary | ICD-10-CM | POA: Diagnosis not present

## 2021-05-04 DIAGNOSIS — Z20822 Contact with and (suspected) exposure to covid-19: Secondary | ICD-10-CM | POA: Diagnosis not present

## 2021-05-26 DIAGNOSIS — M25562 Pain in left knee: Secondary | ICD-10-CM | POA: Diagnosis not present

## 2021-05-26 DIAGNOSIS — M810 Age-related osteoporosis without current pathological fracture: Secondary | ICD-10-CM | POA: Diagnosis not present

## 2021-05-26 DIAGNOSIS — I10 Essential (primary) hypertension: Secondary | ICD-10-CM | POA: Diagnosis not present

## 2021-05-26 DIAGNOSIS — Z780 Asymptomatic menopausal state: Secondary | ICD-10-CM | POA: Diagnosis not present

## 2021-06-27 DIAGNOSIS — Z20822 Contact with and (suspected) exposure to covid-19: Secondary | ICD-10-CM | POA: Diagnosis not present

## 2021-07-06 ENCOUNTER — Ambulatory Visit (INDEPENDENT_AMBULATORY_CARE_PROVIDER_SITE_OTHER): Payer: Medicare Other

## 2021-07-06 ENCOUNTER — Ambulatory Visit: Payer: Self-pay

## 2021-07-06 ENCOUNTER — Ambulatory Visit (INDEPENDENT_AMBULATORY_CARE_PROVIDER_SITE_OTHER): Payer: Medicare Other | Admitting: Family Medicine

## 2021-07-06 ENCOUNTER — Other Ambulatory Visit: Payer: Self-pay

## 2021-07-06 VITALS — BP 124/78 | HR 72 | Ht 61.0 in | Wt 123.0 lb

## 2021-07-06 DIAGNOSIS — M25562 Pain in left knee: Secondary | ICD-10-CM

## 2021-07-06 DIAGNOSIS — F411 Generalized anxiety disorder: Secondary | ICD-10-CM | POA: Insufficient documentation

## 2021-07-06 DIAGNOSIS — E7800 Pure hypercholesterolemia, unspecified: Secondary | ICD-10-CM | POA: Insufficient documentation

## 2021-07-06 DIAGNOSIS — D72819 Decreased white blood cell count, unspecified: Secondary | ICD-10-CM | POA: Insufficient documentation

## 2021-07-06 DIAGNOSIS — G5620 Lesion of ulnar nerve, unspecified upper limb: Secondary | ICD-10-CM | POA: Insufficient documentation

## 2021-07-06 DIAGNOSIS — Z8349 Family history of other endocrine, nutritional and metabolic diseases: Secondary | ICD-10-CM | POA: Insufficient documentation

## 2021-07-06 DIAGNOSIS — I10 Essential (primary) hypertension: Secondary | ICD-10-CM | POA: Diagnosis not present

## 2021-07-06 DIAGNOSIS — L853 Xerosis cutis: Secondary | ICD-10-CM | POA: Insufficient documentation

## 2021-07-06 DIAGNOSIS — M5431 Sciatica, right side: Secondary | ICD-10-CM | POA: Diagnosis not present

## 2021-07-06 DIAGNOSIS — G8929 Other chronic pain: Secondary | ICD-10-CM

## 2021-07-06 DIAGNOSIS — M545 Low back pain, unspecified: Secondary | ICD-10-CM | POA: Diagnosis not present

## 2021-07-06 DIAGNOSIS — M26629 Arthralgia of temporomandibular joint, unspecified side: Secondary | ICD-10-CM | POA: Insufficient documentation

## 2021-07-06 DIAGNOSIS — F5104 Psychophysiologic insomnia: Secondary | ICD-10-CM | POA: Insufficient documentation

## 2021-07-06 DIAGNOSIS — E559 Vitamin D deficiency, unspecified: Secondary | ICD-10-CM | POA: Insufficient documentation

## 2021-07-06 DIAGNOSIS — R5383 Other fatigue: Secondary | ICD-10-CM | POA: Diagnosis not present

## 2021-07-06 DIAGNOSIS — K140 Glossitis: Secondary | ICD-10-CM | POA: Insufficient documentation

## 2021-07-06 DIAGNOSIS — K449 Diaphragmatic hernia without obstruction or gangrene: Secondary | ICD-10-CM | POA: Insufficient documentation

## 2021-07-06 DIAGNOSIS — M8580 Other specified disorders of bone density and structure, unspecified site: Secondary | ICD-10-CM | POA: Insufficient documentation

## 2021-07-06 NOTE — Patient Instructions (Addendum)
Thank you for coming in today.   Please get an Xray today before you leave   I've referred you to Physical Therapy.  Let us know if you don't hear from them in one week.   I recommend you obtained a compression sleeve to help with your joint problems. There are many options on the market however I recommend obtaining a full knee Body Helix compression sleeve.  You can find information (including how to appropriate measure yourself for sizing) can be found at www.Body http://www.lambert.com/.  Many of these products are health savings account (HSA) eligible.  You can use the compression sleeve at any time throughout the day but is most important to use while being active as well as for 2 hours post-activity.   It is appropriate to ice following activity with the compression sleeve in place.   Please use Voltaren gel (Generic Diclofenac Gel) up to 4x daily for pain as needed.  This is available over-the-counter as both the name brand Voltaren gel and the generic diclofenac gel.   Recheck back as needed

## 2021-07-06 NOTE — Progress Notes (Signed)
I, Peterson Lombard, LAT, ATC acting as a scribe for Lynne Leader, MD.  Subjective:    CC: L knee & R buttock pain  HPI: Pt is a 80 y/o female c/o L knee pain x several weeks. Pt locates pain to the posterior aspect of the L knee and the medial aspect.   L knee swelling: yes Mechanical symptoms: no Aggravates: getting into bed Treatments tried: none  Pt also c/o R-sided buttock pain x a few months. Pt locates pain to deep within the R-buttock w/ radiating pain distally through posterior aspect of R thigh and R lower leg  Radiating pain: yes LE numbness/tingling: no LE weakness: no Aggravates: lying supine Treatments tried: stretching   Patient lives in [place].  She still sees Dr. Shelia Media as her primary care provider as she has an established care relationship with him.   Pertinent review of Systems: No fevers or chills  Relevant historical information: Hypertension.  Osteopenia.   Objective:    Vitals:   07/06/21 1300  BP: 124/78  Pulse: 72  SpO2: 98%   General: Well Developed, well nourished, and in no acute distress.   MSK: Left knee minimal joint effusion.  Normal knee motion.  Stable ligamentous exam.  Intact strength.  L-spine: Nontender midline. Normal lumbar motion. Lower extremity strength is intact. Reflexes and sensation are intact. Positive right-sided slump test.  Lab and Radiology Results  X-ray images left knee provided by patient on a CD personally independent interpreted today. Mild DJD.  No acute fractures. Await formal radiology review  X-ray images L-spine obtained today personally and independently interpreted Anterior listhesisL5-S1.  Facet DJD.  No acute fractures Await formal radiology review    Impression and Recommendations:    Assessment and Plan: 80 y.o. 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  Discussed treatment plan and options.  Patient is not very much interested in  medication management or other aggressive interventions such as joint injection.  She is an excellent candidate for conservative management options such as compression sleeve and Voltaren gel. Body helix full knee sleeve  Additionally she is a great candidate for physical therapy for quad strengthening.  Certainly in the future steroid injection or hyaluronic acid injection would be reasonable however she is not symptomatic enough at this time for her to make sense for her.  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  Fortunately she does not have red flag signs or symptoms including weakness.  She is a great candidate for physical therapy again.  We discussed medication management including prednisone and gabapentin.  She does not think her symptoms are bad enough at this time to warrant those medications.  Plan for PT referral in [facility] where she lives.  Happy to prescribe prednisone or gabapentin in the future.  If not improving next step would be MRI for epidural steroid injection planning. Marland Kitchen  PDMP not reviewed this encounter. Orders Placed This Encounter  Procedures   DG Lumbar Spine 2-3 Views    Standing Status:   Future    Number of Occurrences:   1    Standing Expiration Date:   07/06/2022    Order Specific Question:   Reason for Exam (SYMPTOM  OR DIAGNOSIS REQUIRED)    Answer:   low back pain    Order Specific Question:   Preferred imaging location?    Answer:   [facility]   Ambulatory referral to Physical Therapy  Referral Priority:   Routine    Referral Type:   Physical Medicine    Referral Reason:   Specialty Services Required    Requested Specialty:   Physical Therapy    Number of Visits Requested:   1   No orders of the defined types were placed in this encounter.   Discussed warning signs or symptoms. Please see discharge instructions. Patient expresses understanding.   The above documentation has been reviewed and is accurate  and complete Lynne Leader, M.D.

## 2021-07-07 NOTE — Progress Notes (Signed)
X-ray lumbar spine shows some arthritis changes.

## 2021-07-19 DIAGNOSIS — M25562 Pain in left knee: Secondary | ICD-10-CM | POA: Diagnosis not present

## 2021-07-19 DIAGNOSIS — M5431 Sciatica, right side: Secondary | ICD-10-CM | POA: Diagnosis not present

## 2021-07-26 DIAGNOSIS — M25562 Pain in left knee: Secondary | ICD-10-CM | POA: Diagnosis not present

## 2021-07-26 DIAGNOSIS — M5431 Sciatica, right side: Secondary | ICD-10-CM | POA: Diagnosis not present

## 2021-08-02 DIAGNOSIS — M25562 Pain in left knee: Secondary | ICD-10-CM | POA: Diagnosis not present

## 2021-08-02 DIAGNOSIS — M5431 Sciatica, right side: Secondary | ICD-10-CM | POA: Diagnosis not present

## 2021-08-09 DIAGNOSIS — M5431 Sciatica, right side: Secondary | ICD-10-CM | POA: Diagnosis not present

## 2021-08-09 DIAGNOSIS — M25562 Pain in left knee: Secondary | ICD-10-CM | POA: Diagnosis not present

## 2021-08-26 DIAGNOSIS — M25562 Pain in left knee: Secondary | ICD-10-CM | POA: Diagnosis not present

## 2021-08-26 DIAGNOSIS — M5431 Sciatica, right side: Secondary | ICD-10-CM | POA: Diagnosis not present

## 2021-08-31 DIAGNOSIS — H524 Presbyopia: Secondary | ICD-10-CM | POA: Diagnosis not present

## 2021-08-31 DIAGNOSIS — H35341 Macular cyst, hole, or pseudohole, right eye: Secondary | ICD-10-CM | POA: Diagnosis not present

## 2021-09-06 DIAGNOSIS — M25562 Pain in left knee: Secondary | ICD-10-CM | POA: Diagnosis not present

## 2021-09-06 DIAGNOSIS — M5431 Sciatica, right side: Secondary | ICD-10-CM | POA: Diagnosis not present

## 2021-09-13 DIAGNOSIS — M5431 Sciatica, right side: Secondary | ICD-10-CM | POA: Diagnosis not present

## 2021-09-13 DIAGNOSIS — M25562 Pain in left knee: Secondary | ICD-10-CM | POA: Diagnosis not present

## 2021-09-20 ENCOUNTER — Other Ambulatory Visit: Payer: Self-pay | Admitting: Internal Medicine

## 2021-09-20 DIAGNOSIS — F411 Generalized anxiety disorder: Secondary | ICD-10-CM | POA: Diagnosis not present

## 2021-09-20 DIAGNOSIS — Z0000 Encounter for general adult medical examination without abnormal findings: Secondary | ICD-10-CM | POA: Diagnosis not present

## 2021-09-20 DIAGNOSIS — F5104 Psychophysiologic insomnia: Secondary | ICD-10-CM | POA: Diagnosis not present

## 2021-09-20 DIAGNOSIS — M8580 Other specified disorders of bone density and structure, unspecified site: Secondary | ICD-10-CM | POA: Diagnosis not present

## 2021-09-27 DIAGNOSIS — I10 Essential (primary) hypertension: Secondary | ICD-10-CM | POA: Diagnosis not present

## 2021-10-04 DIAGNOSIS — M25562 Pain in left knee: Secondary | ICD-10-CM | POA: Diagnosis not present

## 2021-10-04 DIAGNOSIS — M5431 Sciatica, right side: Secondary | ICD-10-CM | POA: Diagnosis not present

## 2021-10-12 DIAGNOSIS — M5431 Sciatica, right side: Secondary | ICD-10-CM | POA: Diagnosis not present

## 2021-10-12 DIAGNOSIS — M25562 Pain in left knee: Secondary | ICD-10-CM | POA: Diagnosis not present

## 2021-10-18 DIAGNOSIS — Z1231 Encounter for screening mammogram for malignant neoplasm of breast: Secondary | ICD-10-CM | POA: Diagnosis not present

## 2021-10-20 ENCOUNTER — Other Ambulatory Visit: Payer: No Typology Code available for payment source

## 2021-10-25 ENCOUNTER — Ambulatory Visit
Admission: RE | Admit: 2021-10-25 | Discharge: 2021-10-25 | Disposition: A | Discharge status: Home / Self Care | Payer: No Typology Code available for payment source | Source: Ambulatory Visit | Attending: Internal Medicine | Admitting: Internal Medicine

## 2021-10-25 DIAGNOSIS — Z0000 Encounter for general adult medical examination without abnormal findings: Secondary | ICD-10-CM

## 2021-11-16 DIAGNOSIS — M479 Spondylosis, unspecified: Secondary | ICD-10-CM | POA: Diagnosis not present

## 2021-11-16 DIAGNOSIS — M1712 Unilateral primary osteoarthritis, left knee: Secondary | ICD-10-CM | POA: Diagnosis not present

## 2021-12-05 DIAGNOSIS — I10 Essential (primary) hypertension: Secondary | ICD-10-CM | POA: Diagnosis not present

## 2021-12-05 DIAGNOSIS — M810 Age-related osteoporosis without current pathological fracture: Secondary | ICD-10-CM | POA: Diagnosis not present

## 2021-12-05 DIAGNOSIS — D72819 Decreased white blood cell count, unspecified: Secondary | ICD-10-CM | POA: Diagnosis not present

## 2021-12-07 DIAGNOSIS — M810 Age-related osteoporosis without current pathological fracture: Secondary | ICD-10-CM | POA: Diagnosis not present

## 2021-12-07 DIAGNOSIS — M79606 Pain in leg, unspecified: Secondary | ICD-10-CM | POA: Diagnosis not present

## 2021-12-07 DIAGNOSIS — I10 Essential (primary) hypertension: Secondary | ICD-10-CM | POA: Diagnosis not present

## 2021-12-13 DIAGNOSIS — H2512 Age-related nuclear cataract, left eye: Secondary | ICD-10-CM | POA: Diagnosis not present

## 2022-01-10 DIAGNOSIS — H52222 Regular astigmatism, left eye: Secondary | ICD-10-CM | POA: Diagnosis not present

## 2022-01-10 DIAGNOSIS — H2512 Age-related nuclear cataract, left eye: Secondary | ICD-10-CM | POA: Diagnosis not present

## 2022-01-26 DIAGNOSIS — H903 Sensorineural hearing loss, bilateral: Secondary | ICD-10-CM | POA: Diagnosis not present

## 2022-01-26 DIAGNOSIS — H6983 Other specified disorders of Eustachian tube, bilateral: Secondary | ICD-10-CM | POA: Diagnosis not present

## 2022-03-09 DIAGNOSIS — H903 Sensorineural hearing loss, bilateral: Secondary | ICD-10-CM | POA: Diagnosis not present

## 2022-03-09 DIAGNOSIS — H6122 Impacted cerumen, left ear: Secondary | ICD-10-CM | POA: Diagnosis not present

## 2022-03-30 DIAGNOSIS — R519 Headache, unspecified: Secondary | ICD-10-CM | POA: Diagnosis not present

## 2022-04-06 DIAGNOSIS — I10 Essential (primary) hypertension: Secondary | ICD-10-CM | POA: Diagnosis not present

## 2022-04-14 DIAGNOSIS — R519 Headache, unspecified: Secondary | ICD-10-CM | POA: Diagnosis not present

## 2022-04-14 DIAGNOSIS — G8929 Other chronic pain: Secondary | ICD-10-CM | POA: Diagnosis not present

## 2022-04-24 DIAGNOSIS — Z1211 Encounter for screening for malignant neoplasm of colon: Secondary | ICD-10-CM | POA: Diagnosis not present

## 2022-04-24 DIAGNOSIS — Z1212 Encounter for screening for malignant neoplasm of rectum: Secondary | ICD-10-CM | POA: Diagnosis not present

## 2022-04-26 DIAGNOSIS — M810 Age-related osteoporosis without current pathological fracture: Secondary | ICD-10-CM | POA: Diagnosis not present

## 2022-04-26 DIAGNOSIS — I10 Essential (primary) hypertension: Secondary | ICD-10-CM | POA: Diagnosis not present

## 2022-04-26 DIAGNOSIS — M5481 Occipital neuralgia: Secondary | ICD-10-CM | POA: Diagnosis not present

## 2022-04-26 DIAGNOSIS — Z780 Asymptomatic menopausal state: Secondary | ICD-10-CM | POA: Diagnosis not present

## 2022-07-20 DIAGNOSIS — H26493 Other secondary cataract, bilateral: Secondary | ICD-10-CM | POA: Diagnosis not present

## 2022-09-06 DIAGNOSIS — Z780 Asymptomatic menopausal state: Secondary | ICD-10-CM | POA: Diagnosis not present

## 2022-09-20 DIAGNOSIS — I10 Essential (primary) hypertension: Secondary | ICD-10-CM | POA: Diagnosis not present

## 2022-09-22 DIAGNOSIS — I2510 Atherosclerotic heart disease of native coronary artery without angina pectoris: Secondary | ICD-10-CM | POA: Diagnosis not present

## 2022-09-22 DIAGNOSIS — I10 Essential (primary) hypertension: Secondary | ICD-10-CM | POA: Diagnosis not present

## 2022-09-22 DIAGNOSIS — Z0000 Encounter for general adult medical examination without abnormal findings: Secondary | ICD-10-CM | POA: Diagnosis not present

## 2022-09-22 DIAGNOSIS — F5104 Psychophysiologic insomnia: Secondary | ICD-10-CM | POA: Diagnosis not present

## 2022-09-22 DIAGNOSIS — L301 Dyshidrosis [pompholyx]: Secondary | ICD-10-CM | POA: Diagnosis not present

## 2022-09-22 DIAGNOSIS — M8580 Other specified disorders of bone density and structure, unspecified site: Secondary | ICD-10-CM | POA: Diagnosis not present

## 2022-10-24 DIAGNOSIS — Z1231 Encounter for screening mammogram for malignant neoplasm of breast: Secondary | ICD-10-CM | POA: Diagnosis not present

## 2022-10-26 DIAGNOSIS — M810 Age-related osteoporosis without current pathological fracture: Secondary | ICD-10-CM | POA: Diagnosis not present

## 2022-11-01 DIAGNOSIS — R4189 Other symptoms and signs involving cognitive functions and awareness: Secondary | ICD-10-CM | POA: Diagnosis not present

## 2022-11-01 DIAGNOSIS — I10 Essential (primary) hypertension: Secondary | ICD-10-CM | POA: Diagnosis not present

## 2022-11-01 DIAGNOSIS — M810 Age-related osteoporosis without current pathological fracture: Secondary | ICD-10-CM | POA: Diagnosis not present

## 2022-11-17 DIAGNOSIS — L821 Other seborrheic keratosis: Secondary | ICD-10-CM | POA: Diagnosis not present

## 2022-11-17 DIAGNOSIS — L570 Actinic keratosis: Secondary | ICD-10-CM | POA: Diagnosis not present

## 2022-11-17 DIAGNOSIS — L814 Other melanin hyperpigmentation: Secondary | ICD-10-CM | POA: Diagnosis not present

## 2022-12-27 DIAGNOSIS — Z01419 Encounter for gynecological examination (general) (routine) without abnormal findings: Secondary | ICD-10-CM | POA: Diagnosis not present

## 2022-12-27 DIAGNOSIS — Z124 Encounter for screening for malignant neoplasm of cervix: Secondary | ICD-10-CM | POA: Diagnosis not present

## 2023-02-15 DIAGNOSIS — K149 Disease of tongue, unspecified: Secondary | ICD-10-CM | POA: Diagnosis not present

## 2023-02-28 DIAGNOSIS — H35371 Puckering of macula, right eye: Secondary | ICD-10-CM | POA: Diagnosis not present

## 2023-03-20 DIAGNOSIS — H33321 Round hole, right eye: Secondary | ICD-10-CM | POA: Diagnosis not present

## 2023-04-13 DIAGNOSIS — H903 Sensorineural hearing loss, bilateral: Secondary | ICD-10-CM | POA: Diagnosis not present

## 2023-04-16 DIAGNOSIS — I10 Essential (primary) hypertension: Secondary | ICD-10-CM | POA: Diagnosis not present

## 2023-04-16 DIAGNOSIS — R002 Palpitations: Secondary | ICD-10-CM | POA: Diagnosis not present

## 2023-04-24 DIAGNOSIS — R208 Other disturbances of skin sensation: Secondary | ICD-10-CM | POA: Diagnosis not present

## 2023-04-26 DIAGNOSIS — H6993 Unspecified Eustachian tube disorder, bilateral: Secondary | ICD-10-CM | POA: Diagnosis not present

## 2023-04-26 DIAGNOSIS — J387 Other diseases of larynx: Secondary | ICD-10-CM | POA: Diagnosis not present

## 2023-04-26 DIAGNOSIS — K219 Gastro-esophageal reflux disease without esophagitis: Secondary | ICD-10-CM | POA: Diagnosis not present

## 2023-04-26 DIAGNOSIS — H903 Sensorineural hearing loss, bilateral: Secondary | ICD-10-CM | POA: Diagnosis not present

## 2023-05-07 DIAGNOSIS — H26493 Other secondary cataract, bilateral: Secondary | ICD-10-CM | POA: Diagnosis not present

## 2023-05-08 DIAGNOSIS — Z1211 Encounter for screening for malignant neoplasm of colon: Secondary | ICD-10-CM | POA: Diagnosis not present

## 2023-05-08 DIAGNOSIS — D72819 Decreased white blood cell count, unspecified: Secondary | ICD-10-CM | POA: Diagnosis not present

## 2023-05-08 DIAGNOSIS — I10 Essential (primary) hypertension: Secondary | ICD-10-CM | POA: Diagnosis not present

## 2023-05-08 DIAGNOSIS — M810 Age-related osteoporosis without current pathological fracture: Secondary | ICD-10-CM | POA: Diagnosis not present

## 2023-05-15 DIAGNOSIS — H26492 Other secondary cataract, left eye: Secondary | ICD-10-CM | POA: Diagnosis not present

## 2023-06-22 DIAGNOSIS — I872 Venous insufficiency (chronic) (peripheral): Secondary | ICD-10-CM | POA: Diagnosis not present

## 2023-12-10 IMAGING — CT CT CARDIAC CORONARY ARTERY CALCIUM SCORE
3 series · 13 of 20 positions shown, 15 images · non-contrast
Comparison: None.

CLINICAL DATA: 80-year-old Caucasian female with history of
hypertension.

EXAM:
CT CARDIAC CORONARY ARTERY CALCIUM SCORE
TECHNIQUE: Non-contrast imaging through the heart was performed using
prospective ECG gating. Image post processing was performed on an
independent workstation, allowing for quantitative analysis of the
heart and coronary arteries. Note that this exam targets the heart
and the chest was not imaged in its entirety.

[Series 2: calcium scoring 2.00 qr36 bestdiast 69% hrt calciu · axial · 0.40mm/px · z∈[+1498,+1562]mm · 3 of 80 slices shown]
[im 16/80  vessel]
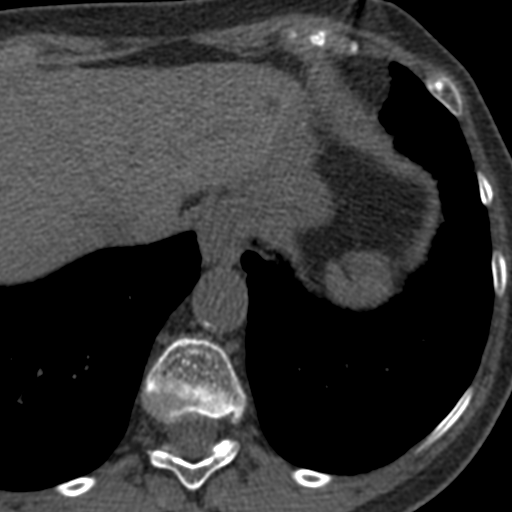
[im 32/80  vessel]
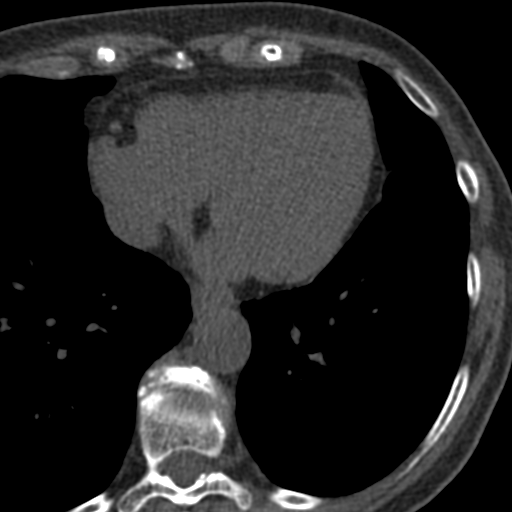
[im 48/80  vessel]
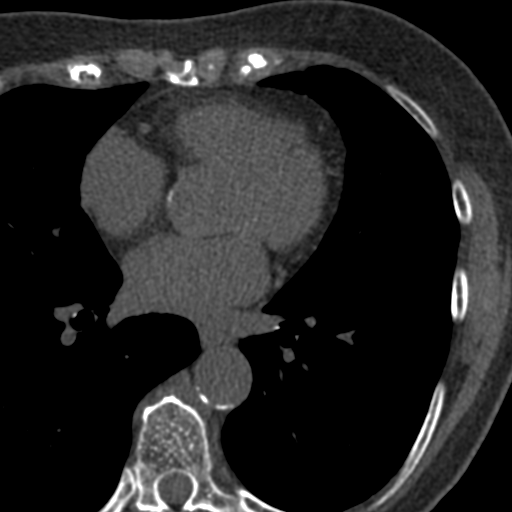

[Series 3: calcium scoring 2.00 br40 bestdiast 69% axial · axial · 0.53mm/px · z∈[+1494,+1598]mm · 5 of 80 slices shown, 7 images]
[im 14/80  vessel]
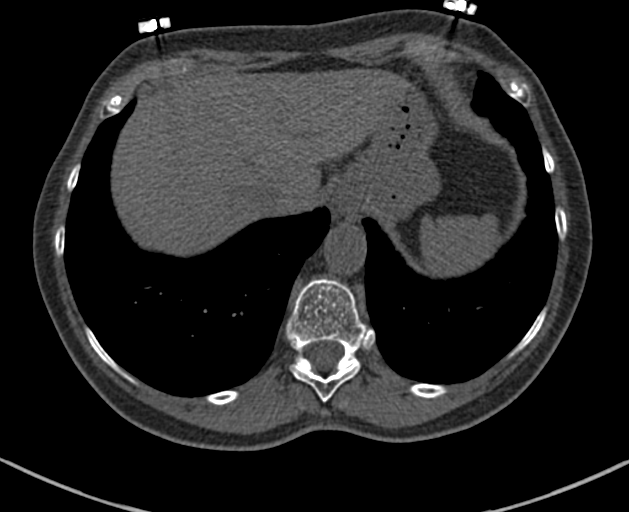
[im 14/80  lung]
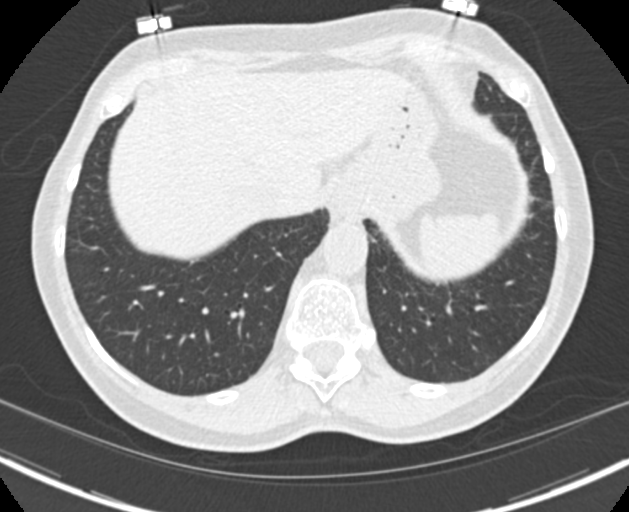
[im 27/80  vessel]
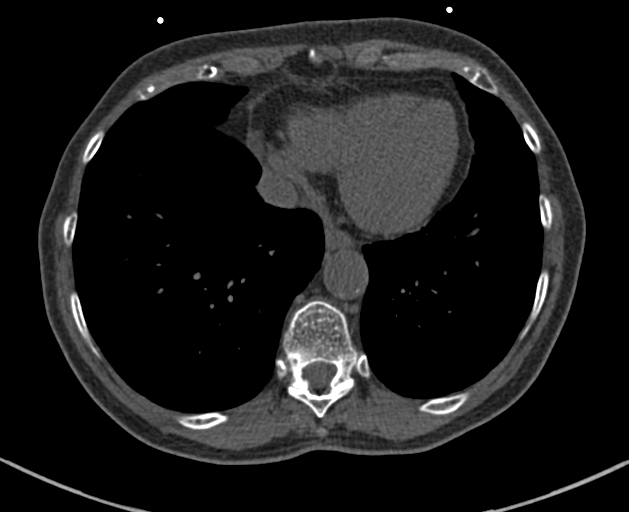
[im 40/80  vessel]
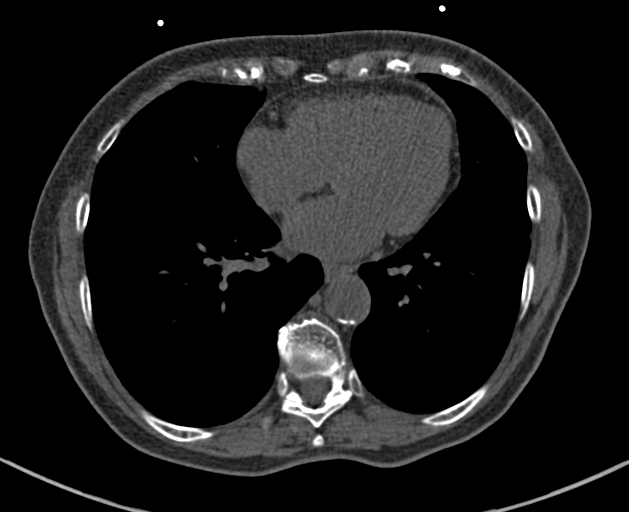
[im 53/80  vessel]
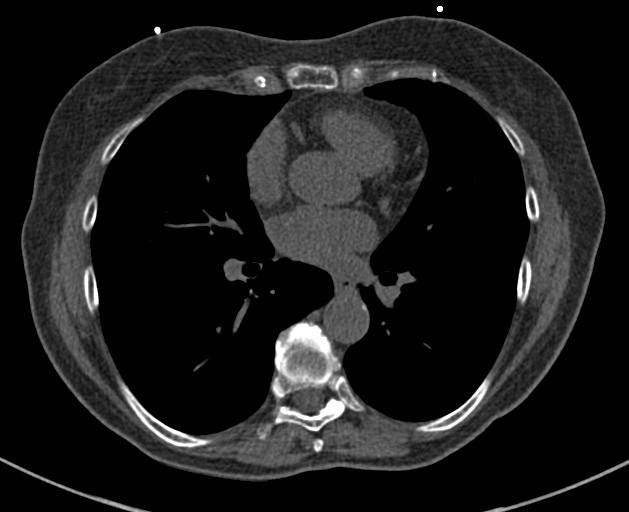
[im 66/80  vessel]
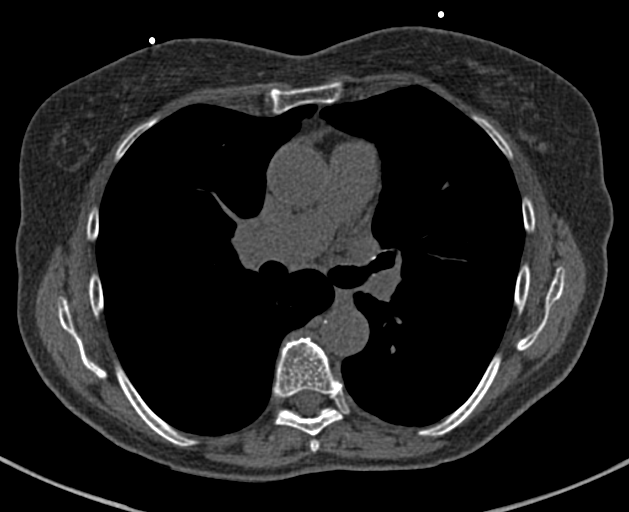
[im 66/80  lung]
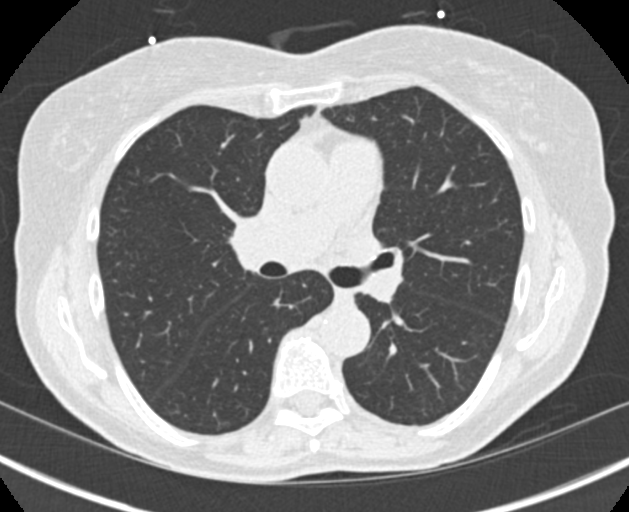

[Series 9: calcium scoring 2.00 br60 bestdiast 69% lungs · axial · 0.53mm/px · z∈[+1494,+1598]mm · 5 of 80 slices shown]
[im 14/80  vessel]
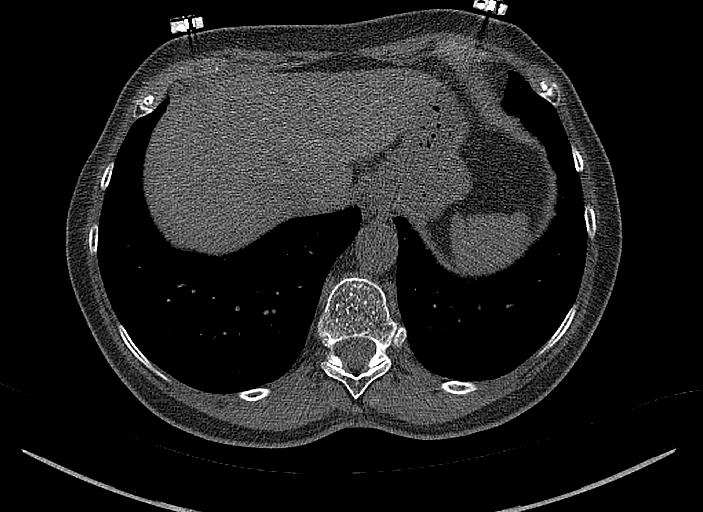
[im 27/80  vessel]
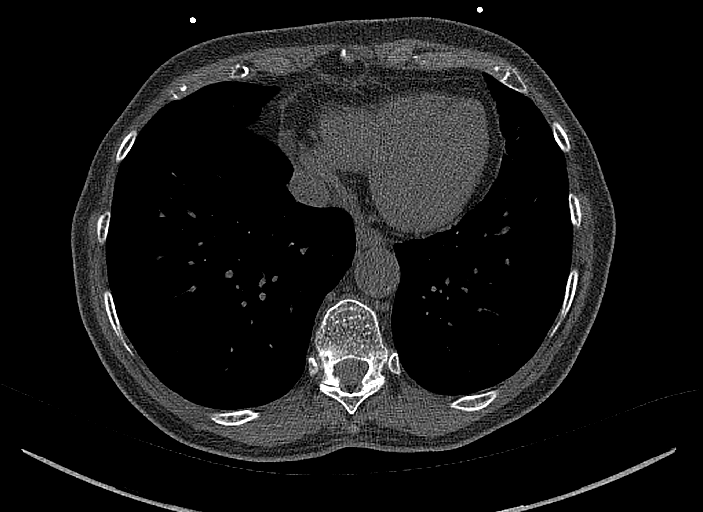
[im 40/80  vessel]
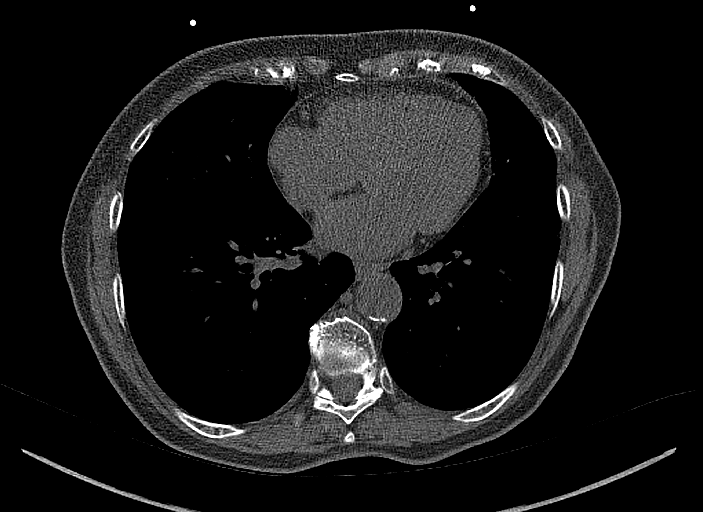
[im 53/80  vessel]
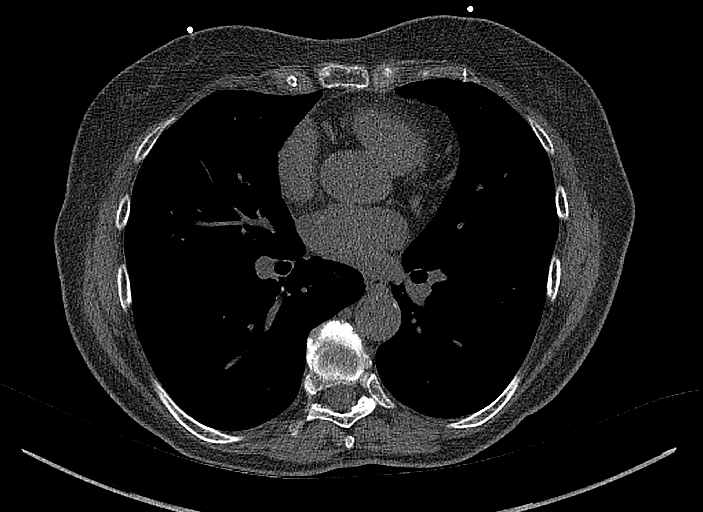
[im 66/80  vessel]
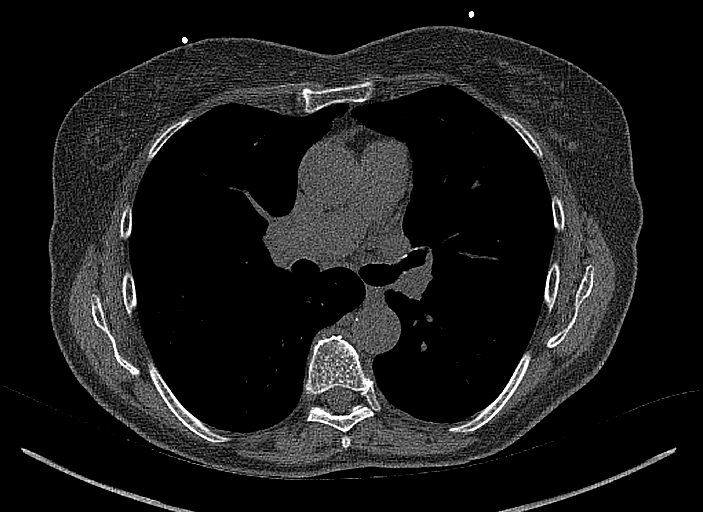

[13 of 20 positions shown; findings below may reference images not displayed]

FINDINGS: CORONARY CALCIUM SCORES:

Left Main: 0

LAD:

LCx: 0

RCA: 0

Total Agatston Score:

[HOSPITAL] percentile: 48

AORTA MEASUREMENTS:

Ascending Aorta: 31 mm

Descending Aorta: 24 mm

OTHER FINDINGS:

The heart size is within normal limits. No pericardial fluid is
identified. The thoracic aorta demonstrates atherosclerosis.
Visualized segments of the thoracic aorta and central pulmonary
arteries are normal in caliber. Visualized mediastinum and hilar
regions demonstrate no lymphadenopathy or masses. There are several
small bilateral pulmonary nodules. 4 mm lateral lingular nodule on
image 26, 5 mm subpleural lateral left lower lobe nodule on image
42, 3 mm lateral subpleural left lower lobe nodule on image 54 and 3
mm lateral subpleural right lower lobe nodule on image 45.
Visualized lungs show no evidence of pulmonary edema, consolidation,
pneumothorax or pleural fluid. Visualized upper abdomen and bony
structures are unremarkable.
IMPRESSION: 1. Coronary calcium score of 84.6 is at the 48th percentile for the
patient's age, sex and race.
2. Atherosclerosis of the thoracic aorta.
3. Multiple small bilateral pulmonary nodules which are peripheral
and measure 5 mm or less.
Multiple pulmonary nodules. Most severe: 5 mm solid pulmonary
nodule. No routine follow-up imaging is recommended per [place]
Society Guidelines.
These guidelines do not apply to immunocompromised patients and
patients with cancer. Follow up in patients with significant
comorbidities as clinically warranted. For lung cancer screening,
adhere to Lung-RADS guidelines. Reference: Radiology. 1880;
